# Patient Record
Sex: Male | Born: 1975 | Race: White | Hispanic: No | Marital: Married | Smoking: Never smoker
Health system: Southern US, Community
[De-identification: ages and names within clinical notes are randomized; demographics above are authoritative.]

---

## 2020-04-06 ENCOUNTER — Ambulatory Visit
Admission: RE | Admit: 2020-04-06 | Discharge: 2020-04-06 | Disposition: A | Discharge status: Home / Self Care | Payer: 59 | Source: Ambulatory Visit | Attending: Family Medicine | Admitting: Family Medicine

## 2020-04-06 ENCOUNTER — Other Ambulatory Visit: Payer: Self-pay

## 2020-04-06 ENCOUNTER — Encounter: Payer: Self-pay | Admitting: Family Medicine

## 2020-04-06 ENCOUNTER — Ambulatory Visit (INDEPENDENT_AMBULATORY_CARE_PROVIDER_SITE_OTHER): Payer: 59 | Admitting: Family Medicine

## 2020-04-06 VITALS — BP 120/70 | HR 62 | Temp 98.5°F | Ht 70.0 in | Wt 199.6 lb

## 2020-04-06 DIAGNOSIS — Z8616 Personal history of COVID-19: Secondary | ICD-10-CM

## 2020-04-06 DIAGNOSIS — E7800 Pure hypercholesterolemia, unspecified: Secondary | ICD-10-CM | POA: Diagnosis not present

## 2020-04-06 DIAGNOSIS — Z8249 Family history of ischemic heart disease and other diseases of the circulatory system: Secondary | ICD-10-CM | POA: Diagnosis not present

## 2020-04-06 DIAGNOSIS — R0789 Other chest pain: Secondary | ICD-10-CM | POA: Diagnosis not present

## 2020-04-06 LAB — LIPID PANEL

## 2020-04-06 NOTE — Patient Instructions (Signed)
You will hear from [facility].   Go to [facility] for your chest X ray.   I will be in touch with your lab results.

## 2020-04-06 NOTE — Progress Notes (Signed)
His chest XR is normal.

## 2020-04-06 NOTE — Progress Notes (Signed)
Subjective:    Patient ID: Thomas Kerr, male    DOB: March 13, 1976, 44 y.o.   MRN: [id number]  HPI Chief Complaint  Patient presents with  . new pt    new pt, had covid back in august, but work made him get covid vaccine, 11/1. arm hurt and pin needles on left side, under left arm having achness an then still having some arm pain,  nervous about getting second.    He is new to the practice. Moved here from [place].  Previous provider PCP in [place] he had Covid in August 2021. Symptoms with chills, headache, fatigue but did not have to go to the hospital.  States he was not back to his baseline but close. He was not able to keep up with his dog jogging like usual.  States prior to getting his vaccine, he was doing a new workout with his wife called "bodypump" which included push ups and a lot of upper body exercises.   States he received his initial Pfizer covid vaccine in his left arm on 03/08/2020. States the first week afterwards he felt achy and had arm pain. States week 2 after his vaccine, he noticed "pin prick" sensation in his left upper chest. This was intermittent and random. Not with exertion.  States pain woke him up at times.  States he is not currently having pain.   Pain then became a dull pain and spread across his chest. More recently he has noticed a throbbing sensation under his left axilla that is intermittent. No aggravating or alleviating factors.  No coughing, orthopnea, shortness of breath, abdominal pain, N/V/D, LE edema. Denies fever, chills, or dizziness.   He questions whether he should get his 2nd covid vaccine.states it is required for his job.    His father had heart valve replaced and aneurysm repair at age 72.  PGF died of a heart attack at age 68.  Mother with HTN   Hx of HL. LDL 161 in 2020.  Hx of renal insufficiency   History of blood in stool. Saw GI in [place] for this.   He is married with 3 children ages 4,8, and 77. Works as Advice worker.     Review of Systems Pertinent positives and negatives in the history of present illness.     Objective:   Physical Exam BP 120/70   Pulse 62   Temp 98.5 F (36.9 C)   Ht 5\' 10"  (1.778 m)   Wt 199 lb 9.6 oz (90.5 kg)   BMI 28.64 kg/m   Alert and in no distress.  Cardiac exam shows a regular sinus rhythm without murmurs or gallops. Lungs are clear to auscultation.  No chest wall tenderness or abnormalities.  No axillary lymphadenopathy.  Extremities without edema.  Skin is warm and dry.      Assessment & Plan:  Intermittent left-sided chest pain - Plan: CBC with Differential/Platelet, Comprehensive metabolic panel, Lipid panel, DG Chest 2 View, Ambulatory referral to Cardiology  -He is new to practice and here to establish care.  Reports that he is fasting today.  He is not actively having chest pain. Is concerned that his pain may be related to the Covid vaccine that he received on March 08, 2020 and he is hesitant to get his second vaccine which is due.  Discussed that he had more cardiopulmonary symptoms after having Covid infection in August 2021 and that it is difficult to discern whether this may be  related.  EKG shows NSR, rate 68. No acute changes. Read by myself and Dr. Susann Givens.  Discussed risk factors for heart disease which are few including elevated LDL and family history. I will send him for chest x-ray, get blood work and refer to cardiology for further evaluation.  Elevated LDL cholesterol level - Plan: Lipid panel -Discussed that his LDL was 161 in 2020 when checked by his previous PCP.  Recommend low-fat, low-cholesterol diet and getting adequate exercise.  He typically exercises regularly but since his chest pain he has not been quite as active.  Family history of heart attack - Plan: Ambulatory referral to Cardiology  Family history of heart valve abnormality - Plan: Ambulatory referral to Cardiology  History of COVID-19 - Plan: Ambulatory referral  to Cardiology -Reports fully recovering since Covid infection in August 2021.

## 2020-04-07 LAB — LIPID PANEL
Chol/HDL Ratio: 4.2 ratio (ref 0.0–5.0)
Cholesterol, Total: 256 mg/dL — ABNORMAL HIGH (ref 100–199)
HDL: 61 mg/dL (ref >39)
LDL Chol Calc (NIH): 177 mg/dL — ABNORMAL HIGH (ref 0–99)
Triglycerides: 101 mg/dL (ref 0–149)
VLDL Cholesterol Cal: 18 mg/dL (ref 5–40)

## 2020-04-07 LAB — COMPREHENSIVE METABOLIC PANEL
ALT: 46 IU/L — ABNORMAL HIGH (ref 0–44)
AST: 24 IU/L (ref 0–40)
Albumin/Globulin Ratio: 2.5 — ABNORMAL HIGH (ref 1.2–2.2)
Albumin: 5.2 g/dL — ABNORMAL HIGH (ref 4.0–5.0)
Alkaline Phosphatase: 84 IU/L (ref 44–121)
BUN/Creatinine Ratio: 9 (ref 9–20)
BUN: 13 mg/dL (ref 6–24)
Bilirubin Total: 0.6 mg/dL (ref 0.0–1.2)
CO2: 23 mmol/L (ref 20–29)
Calcium: 9.7 mg/dL (ref 8.7–10.2)
Chloride: 101 mmol/L (ref 96–106)
Creatinine, Ser: 1.41 mg/dL — ABNORMAL HIGH (ref 0.76–1.27)
GFR calc Af Amer: 71 mL/min/{1.73_m2} (ref >59)
GFR calc non Af Amer: 61 mL/min/{1.73_m2} (ref >59)
Globulin, Total: 2.1 g/dL (ref 1.5–4.5)
Glucose: 102 mg/dL — ABNORMAL HIGH (ref 65–99)
Potassium: 4.5 mmol/L (ref 3.5–5.2)
Sodium: 141 mmol/L (ref 134–144)
Total Protein: 7.3 g/dL (ref 6.0–8.5)

## 2020-04-07 LAB — CBC WITH DIFFERENTIAL/PLATELET
Basophils Absolute: 0 10*3/uL (ref 0.0–0.2)
Basos: 1 %
EOS (ABSOLUTE): 0 10*3/uL (ref 0.0–0.4)
Eos: 0 %
Hematocrit: 46.6 % (ref 37.5–51.0)
Hemoglobin: 16 g/dL (ref 13.0–17.7)
Immature Grans (Abs): 0 10*3/uL (ref 0.0–0.1)
Immature Granulocytes: 0 %
Lymphocytes Absolute: 1.5 10*3/uL (ref 0.7–3.1)
Lymphs: 24 %
MCH: 29.7 pg (ref 26.6–33.0)
MCHC: 34.3 g/dL (ref 31.5–35.7)
MCV: 87 fL (ref 79–97)
Monocytes Absolute: 0.4 10*3/uL (ref 0.1–0.9)
Monocytes: 7 %
Neutrophils Absolute: 4.3 10*3/uL (ref 1.4–7.0)
Neutrophils: 68 %
Platelets: 308 10*3/uL (ref 150–450)
RBC: 5.38 x10E6/uL (ref 4.14–5.80)
RDW: 12.1 % (ref 11.6–15.4)
WBC: 6.3 10*3/uL (ref 3.4–10.8)

## 2020-04-08 ENCOUNTER — Encounter: Payer: Self-pay | Admitting: Family Medicine

## 2020-04-08 DIAGNOSIS — R7989 Other specified abnormal findings of blood chemistry: Secondary | ICD-10-CM

## 2020-04-08 DIAGNOSIS — R7401 Elevation of levels of liver transaminase levels: Secondary | ICD-10-CM | POA: Insufficient documentation

## 2020-04-08 HISTORY — DX: Elevation of levels of liver transaminase levels: R74.01

## 2020-04-08 HISTORY — DX: Other specified abnormal findings of blood chemistry: R79.89

## 2020-04-08 NOTE — Progress Notes (Signed)
His kidney function is abnormal as it has been in the past.  Also his LDL or bad cholesterol is significantly elevated.  This may be due to a familial reason and not necessarily his diet or exercise routine.  This is a risk factor for heart disease I do recommend that he discuss this with his cardiologist at his upcoming appointment.  One of his liver enzymes, the ALT, is just marginally elevated.  I recommend that he return for a follow up visit and we will recheck his kidney and liver functions in 1 month. In the meantime he should stay well-hydrated, avoid alcohol and eat a low-fat diet.

## 2020-04-15 ENCOUNTER — Other Ambulatory Visit: Payer: Self-pay

## 2020-04-15 ENCOUNTER — Encounter: Payer: Self-pay | Admitting: Internal Medicine

## 2020-04-15 ENCOUNTER — Ambulatory Visit (INDEPENDENT_AMBULATORY_CARE_PROVIDER_SITE_OTHER): Payer: 59 | Admitting: Internal Medicine

## 2020-04-15 VITALS — BP 138/92 | HR 61 | Ht 70.0 in | Wt 202.0 lb

## 2020-04-15 DIAGNOSIS — U071 COVID-19: Secondary | ICD-10-CM

## 2020-04-15 DIAGNOSIS — E782 Mixed hyperlipidemia: Secondary | ICD-10-CM | POA: Diagnosis not present

## 2020-04-15 DIAGNOSIS — R079 Chest pain, unspecified: Secondary | ICD-10-CM

## 2020-04-15 DIAGNOSIS — Z8249 Family history of ischemic heart disease and other diseases of the circulatory system: Secondary | ICD-10-CM

## 2020-04-15 NOTE — Progress Notes (Signed)
Cardiology Office Note:    Date:  04/15/2020   ID:  Thomas Kerr, DOB 03-02-1976, MRN [id number]  PCP:  Avanell Shackleton, NP-C  [facility] Cardiologist:  No primary care provider on file.  [facility] Electrophysiologist:  None   CC: Chest Pain Consulted for the evaluation of chest pain at the behest of [place], Vickie L, NP-C  History of Present Illness:    Thomas Kerr is a 44 y.o. male with a hx of HLD, prior history of COVID-19 in 8/21,  CKD, mild elevation in AST/ALT who presents for evaluation.  Patient notes that he is feeling of needle prick chest pain on both sides.  More of a dull, position pain. Resolved over time.  Has improved in frequency with time.  [place]  Last 10-15 seconds.   Discomfort occured spontaneously one week after COVID Vaccine (November, had August COVID-infection) worsens with position, and improves with massage.  No exertional chest pain and is able to do yardwork and exercise but has paused exercise presently. No syncope or near syncope.  No SOB and DOE  Notes no palpitations or funny heart beats.     Patient reports NO prior cardiac testing including  echo,  stress test,  heart catheterizations.   Past Medical History:  Diagnosis Date  . Elevated ALT measurement 04/08/2020  . Elevated serum creatinine 04/08/2020    No past surgical history on file.  Current Medications: No outpatient medications have been marked as taking for the 04/15/20 encounter (Office Visit) with Christell Constant, MD.     Allergies:   Amoxicillin   Social History   Socioeconomic History  . Marital status: Married    Spouse name: Not on file  . Number of children: Not on file  . Years of education: Not on file  . Highest education level: Not on file  Occupational History  . Not on file  Tobacco Use  . Smoking status: Never Smoker  . Smokeless tobacco: Never Used  Substance and Sexual Activity  . Alcohol use: Yes     Comment: rarely   . Drug use: Never  . Sexual activity: Not on file  Other Topics Concern  . Not on file  Social History Narrative  . Not on file   Social Determinants of Health   Financial Resource Strain: Not on file  Food Insecurity: Not on file  Transportation Needs: Not on file  Physical Activity: Not on file  Stress: Not on file  Social Connections: Not on file     Family History: The patient's family history is relevant for below: Denies family history of sudden cardiac death including drowning, car accidents, or unexplained deaths in the family outside of grandfather below No history of non-ischemic cardiomyopathies including hypertrophic cardiomyopathy, left ventricular non-compaction, or arrhythmogenic right ventricular cardiomyopathy. No history of bicuspid aortic valve but has a history of aortic aneurysm in father possible aortic dissection in his grandfather at age 63. History of coronary artery disease notable for no members. History of arrhythmia notable for no members.  ROS:   Please see the history of present illness.     All other systems reviewed and are negative.  EKGs/Labs/Other Studies Reviewed:    The following studies were reviewed today:  EKG:   04/06/20: SR 68 WNL, no PR depression or ST elevation  Recent Labs: 04/06/2020: ALT 46; BUN 13; Creatinine, Ser 1.41; Hemoglobin 16.0; Platelets 308; Potassium 4.5; Sodium 141  Recent Lipid Panel  Component Value Date/Time   CHOL 256 (H) 04/06/2020 1143   TRIG 101 04/06/2020 1143   HDL 61 04/06/2020 1143   CHOLHDL 4.2 04/06/2020 1143   LDLCALC 177 (H) 04/06/2020 1143    Risk Assessment/Calculations:     The 10-year ASCVD risk score Mikey Bussing DC Brooke Bonito., et al., 2013) is: 2%   Values used to calculate the score:     Age: 13 years     Sex: Male     Is Non-Hispanic African American: No     Diabetic: No     Tobacco smoker: No     Systolic Blood Pressure: 622 mmHg     Is BP treated: No     HDL  Cholesterol: 61 mg/dL     Total Cholesterol: 256 mg/dL   Physical Exam:    VS:  BP (!) 138/92   Pulse 61   Ht _0  (1.778 m)   Wt 202 lb (91.6 kg)   SpO2 97%   BMI 28.98 kg/m     Wt Readings from Last 3 Encounters:  04/15/20 202 lb (91.6 kg)  04/06/20 199 lb 9.6 oz (90.5 kg)    GEN:  Well nourished, well developed in no acute distress HEENT: Normal NECK: No JVD; No carotid bruits LYMPHATICS: No lymphadenopathy CARDIAC: RRR, no murmurs, rubs, gallops RESPIRATORY:  Clear to auscultation without rales, wheezing or rhonchi  ABDOMEN: Soft, non-tender, non-distended MUSCULOSKELETAL:  No edema; No deformity  SKIN: Warm and dry NEUROLOGIC:  Alert and oriented x 3 PSYCHIATRIC:  Normal affect   ASSESSMENT:    1. Chest pain of uncertain etiology   2. Mixed hyperlipidemia   3. Family history of aortic aneurysm   4. COVID    PLAN:    In order of problems listed above:  Chest Pain CKD In the setting of COVID 19 HLD - get hsCRP and ESR - BMP, CBC - ASCVD risk 2%; will try dietary intervention to start - Would recommend an echocardiogram to assess LVEF and exclude WMA.  - continue ambulatory BP monitoring - will defer exercise until after testing; low threshold for CMR  Hyperlipidemia -LDL goal above 100 - ASCVD risk as above - gave education on dietary changes  Family history of Aortic Aneurysm with possible history of Aortic Dissection - Will get echocardiogram  3 month follow up unless new symptoms or abnormal test results warranting change in plan  Would be reasonable for  APP Follow up   Shared Decision Making/Informed Consent      Patient amenable to plan  Medication Adjustments/Labs and Tests Ordered: Current medicines are reviewed at length with the patient today.  Concerns regarding medicines are outlined above.  Orders Placed This Encounter  Procedures  . CRP High sensitivity  . Sedimentation rate  . CBC  . Basic metabolic panel  .  ECHOCARDIOGRAM COMPLETE   No orders of the defined types were placed in this encounter.   Patient Instructions  Medication Instructions:   Your physician recommends that you continue on your current medications as directed. Please refer to the Current Medication list given to you today.  *If you need a refill on your cardiac medications before your next appointment, please call your pharmacy*   Lab Work:  TODAY--CRP HIGH SENSITIVITY, ESR, CBC, BMET  If you have labs (blood work) drawn today and your tests are completely normal, you will receive your results only by: Marland Kitchen MyChart Message (if you have MyChart) OR . A paper copy in the mail If  you have any lab test that is abnormal or we need to change your treatment, we will call you to review the results.  Testing/Procedures:  Your physician has requested that you have an echocardiogram. Echocardiography is a painless test that uses sound waves to create images of your heart. It provides your doctor with information about the size and shape of your heart and how well your heart's chambers and valves are working. This procedure takes approximately one hour. There are no restrictions for this procedure.   Follow-Up:  3 MONTHS IN THE OFFICE WITH DR. Gasper Sells      Signed, Werner Lean, MD  04/15/2020 9:40 AM    [facility]

## 2020-04-15 NOTE — Patient Instructions (Signed)
Medication Instructions:   Your physician recommends that you continue on your current medications as directed. Please refer to the Current Medication list given to you today.  *If you need a refill on your cardiac medications before your next appointment, please call your pharmacy*   Lab Work:  TODAY--CRP HIGH SENSITIVITY, ESR, CBC, BMET  If you have labs (blood work) drawn today and your tests are completely normal, you will receive your results only by: Marland Kitchen MyChart Message (if you have MyChart) OR . A paper copy in the mail If you have any lab test that is abnormal or we need to change your treatment, we will call you to review the results.  Testing/Procedures:  Your physician has requested that you have an echocardiogram. Echocardiography is a painless test that uses sound waves to create images of your heart. It provides your doctor with information about the size and shape of your heart and how well your heart's chambers and valves are working. This procedure takes approximately one hour. There are no restrictions for this procedure.   Follow-Up:  3 MONTHS IN THE OFFICE WITH DR. Gasper Sells

## 2020-04-16 LAB — CBC
Hematocrit: 46.3 % (ref 37.5–51.0)
Hemoglobin: 15.5 g/dL (ref 13.0–17.7)
MCH: 28.9 pg (ref 26.6–33.0)
MCHC: 33.5 g/dL (ref 31.5–35.7)
MCV: 86 fL (ref 79–97)
Platelets: 287 10*3/uL (ref 150–450)
RBC: 5.36 x10E6/uL (ref 4.14–5.80)
RDW: 12.3 % (ref 11.6–15.4)
WBC: 6.5 10*3/uL (ref 3.4–10.8)

## 2020-04-16 LAB — BASIC METABOLIC PANEL
BUN/Creatinine Ratio: 9 (ref 9–20)
BUN: 12 mg/dL (ref 6–24)
CO2: 24 mmol/L (ref 20–29)
Calcium: 9.9 mg/dL (ref 8.7–10.2)
Chloride: 101 mmol/L (ref 96–106)
Creatinine, Ser: 1.33 mg/dL — ABNORMAL HIGH (ref 0.76–1.27)
GFR calc Af Amer: 76 mL/min/{1.73_m2} (ref >59)
GFR calc non Af Amer: 65 mL/min/{1.73_m2} (ref >59)
Glucose: 99 mg/dL (ref 65–99)
Potassium: 4.8 mmol/L (ref 3.5–5.2)
Sodium: 138 mmol/L (ref 134–144)

## 2020-04-16 LAB — HIGH SENSITIVITY CRP: CRP, High Sensitivity: 0.48 mg/L (ref 0.00–3.00)

## 2020-04-16 LAB — SEDIMENTATION RATE: Sed Rate: 2 mm/hr (ref 0–15)

## 2020-04-20 ENCOUNTER — Ambulatory Visit (HOSPITAL_COMMUNITY): Payer: 59 | Attending: Cardiology

## 2020-04-20 ENCOUNTER — Other Ambulatory Visit: Payer: Self-pay

## 2020-04-20 DIAGNOSIS — U071 COVID-19: Secondary | ICD-10-CM | POA: Diagnosis present

## 2020-04-20 DIAGNOSIS — Z8249 Family history of ischemic heart disease and other diseases of the circulatory system: Secondary | ICD-10-CM | POA: Diagnosis not present

## 2020-04-20 DIAGNOSIS — R079 Chest pain, unspecified: Secondary | ICD-10-CM

## 2020-04-20 LAB — ECHOCARDIOGRAM COMPLETE
Area-P 1/2: 3.65 cm2
S' Lateral: 2.9 cm

## 2020-05-10 ENCOUNTER — Ambulatory Visit: Payer: 59 | Admitting: Family Medicine

## 2020-07-14 ENCOUNTER — Ambulatory Visit: Payer: 59 | Admitting: Internal Medicine

## 2020-10-24 ENCOUNTER — Encounter (HOSPITAL_COMMUNITY): Payer: Self-pay | Admitting: Emergency Medicine

## 2020-10-24 ENCOUNTER — Emergency Department (HOSPITAL_COMMUNITY)
Admission: EM | Admit: 2020-10-24 | Discharge: 2020-10-24 | Disposition: A | Discharge status: Home / Self Care | Payer: 59 | Attending: Emergency Medicine | Admitting: Emergency Medicine

## 2020-10-24 ENCOUNTER — Other Ambulatory Visit: Payer: Self-pay

## 2020-10-24 ENCOUNTER — Emergency Department (HOSPITAL_COMMUNITY): Payer: 59

## 2020-10-24 DIAGNOSIS — R0789 Other chest pain: Secondary | ICD-10-CM | POA: Insufficient documentation

## 2020-10-24 DIAGNOSIS — R5383 Other fatigue: Secondary | ICD-10-CM | POA: Diagnosis not present

## 2020-10-24 DIAGNOSIS — R42 Dizziness and giddiness: Secondary | ICD-10-CM | POA: Insufficient documentation

## 2020-10-24 DIAGNOSIS — R0602 Shortness of breath: Secondary | ICD-10-CM | POA: Diagnosis not present

## 2020-10-24 DIAGNOSIS — Z8616 Personal history of COVID-19: Secondary | ICD-10-CM | POA: Diagnosis not present

## 2020-10-24 DIAGNOSIS — R079 Chest pain, unspecified: Secondary | ICD-10-CM

## 2020-10-24 LAB — TROPONIN I (HIGH SENSITIVITY)
Troponin I (High Sensitivity): 3 ng/L (ref <18)
Troponin I (High Sensitivity): <2 ng/L (ref <18)

## 2020-10-24 LAB — BASIC METABOLIC PANEL
Anion gap: 6 (ref 5–15)
BUN: 16 mg/dL (ref 6–20)
CO2: 28 mmol/L (ref 22–32)
Calcium: 8.9 mg/dL (ref 8.9–10.3)
Chloride: 102 mmol/L (ref 98–111)
Creatinine, Ser: 1.38 mg/dL — ABNORMAL HIGH (ref 0.61–1.24)
GFR, Estimated: >60 mL/min (ref >60)
Glucose, Bld: 94 mg/dL (ref 70–99)
Potassium: 3.8 mmol/L (ref 3.5–5.1)
Sodium: 136 mmol/L (ref 135–145)

## 2020-10-24 LAB — CBC
HCT: 48 % (ref 39.0–52.0)
Hemoglobin: 16.1 g/dL (ref 13.0–17.0)
MCH: 29.8 pg (ref 26.0–34.0)
MCHC: 33.5 g/dL (ref 30.0–36.0)
MCV: 88.7 fL (ref 80.0–100.0)
Platelets: 276 10*3/uL (ref 150–400)
RBC: 5.41 MIL/uL (ref 4.22–5.81)
RDW: 11.6 % (ref 11.5–15.5)
WBC: 7.9 10*3/uL (ref 4.0–10.5)
nRBC: 0 % (ref 0.0–0.2)

## 2020-10-24 LAB — D-DIMER, QUANTITATIVE: D-Dimer, Quant: 0.38 ug/mL-FEU (ref 0.00–0.50)

## 2020-10-24 MED ORDER — KETOROLAC TROMETHAMINE 15 MG/ML IJ SOLN
15.0000 mg | Freq: Once | INTRAMUSCULAR | Status: AC
  Administered 2020-10-24: 15 mg via INTRAVENOUS
  Filled 2020-10-24: qty 1

## 2020-10-24 MED ORDER — SODIUM CHLORIDE 0.9 % IV BOLUS
1000.0000 mL | Freq: Once | INTRAVENOUS | Status: AC
  Administered 2020-10-24: 1000 mL via INTRAVENOUS

## 2020-10-24 NOTE — ED Notes (Signed)
Patient verbalized understanding of discharge instructions. Opportunity for questions and answers.  

## 2020-10-24 NOTE — ED Triage Notes (Signed)
Pt here from home for L side dull intermittent cp x2-3 days w/ lightheadedness, sob, and fatigue.

## 2020-10-24 NOTE — Discharge Instructions (Addendum)
If you develop recurrent, continued, or worsening chest pain, shortness of breath, fever, vomiting, abdominal or back pain, or any other new/concerning symptoms then return to the ER for evaluation.  

## 2020-10-24 NOTE — ED Provider Notes (Signed)
Emergency Medicine Provider Triage Evaluation Note  Thomas Kerr , a 45 y.o. male  was evaluated in triage.  Pt complains of gradual onset, constant, waxing and waning, dull/achy, left sided chest pain x 2-3 days. Pt now having SOB at rest and lightheadedness. Went to UC and was sent here for further eval. Was given 324 mg ASA at UC without much relief. Currently a 3/10. Marland Kitchen  Review of Systems  Positive: + chest pain, SOB, lightheaded Negative: - nausea, vomiting  Physical Exam  BP (!) 161/104 (BP Location: Left Arm)   Pulse 88   Temp 98.7 F (37.1 C) (Oral)   Resp 18   Ht 5\' 10"  (1.778 m)   Wt 88.5 kg   SpO2 99%   BMI 27.98 kg/m  Gen:   Awake, no distress   Resp:  Normal effort  MSK:   Moves extremities without difficulty  Other:    Medical Decision Making  Medically screening exam initiated at 3:46 PM.  Appropriate orders placed.  Thomas Kerr was informed that the remainder of the evaluation will be completed by another provider, this initial triage assessment does not replace that evaluation, and the importance of remaining in the ED until their evaluation is complete.     Thomas Laine, PA-C 10/24/20 1548    10/26/20, MD 10/24/20 [phone]

## 2020-10-24 NOTE — ED Provider Notes (Signed)
[facility] EMERGENCY DEPARTMENT Provider Note   CSN: [id number] Arrival date & time: 10/24/20  1519     History Chief Complaint  Patient presents with   Chest Pain    Thomas Kerr is a 45 y.o. male.  HPI 45 year old male presents with chest pain.  It has been there for at least the last 2-3 days.  Comes and goes many times a day and is a dull sensation just left of the midline.  Usually lasts only a few seconds.  He is also been short of breath this entire time as well.  Today he felt fatigued/lightheaded and so he want to come get checked out.  This is different than the sharp chest pain he got a work-up for last year.  He states he had a cold a few weeks ago but no current cough.  No pleuritic symptoms.  Nothing he does makes it better or worse but it occurs numerous times per day.  No leg swelling or recent travel.  Past Medical History:  Diagnosis Date   Elevated ALT measurement 04/08/2020   Elevated serum creatinine 04/08/2020    Patient Active Problem List   Diagnosis Date Noted   Elevated ALT measurement 04/08/2020   Elevated serum creatinine 04/08/2020   Intermittent left-sided chest pain 04/06/2020   Elevated LDL cholesterol level 04/06/2020   Family history of heart valve abnormality 04/06/2020   History of COVID-19 04/06/2020    History reviewed. No pertinent surgical history.     History reviewed. No pertinent family history.  Social History   Tobacco Use   Smoking status: Never   Smokeless tobacco: Never  Substance Use Topics   Alcohol use: Yes    Comment: rarely    Drug use: Never    Home Medications Prior to Admission medications   Not on File    Allergies    Amoxicillin  Review of Systems   Review of Systems  Constitutional:  Positive for fatigue.  Respiratory:  Positive for shortness of breath. Negative for cough.   Cardiovascular:  Positive for chest pain. Negative for leg swelling.  Gastrointestinal:  Negative for  abdominal pain.  Neurological:  Positive for light-headedness.  All other systems reviewed and are negative.  Physical Exam Updated Vital Signs BP 132/85   Pulse (!) 58   Temp 98.1 F (36.7 C) (Oral)   Resp 20   Ht 5\' 10"  (1.778 m)   Wt 88.5 kg   SpO2 93%   BMI 27.98 kg/m   Physical Exam Vitals and nursing note reviewed.  Constitutional:      General: He is not in acute distress.    Appearance: He is well-developed. He is not ill-appearing or diaphoretic.  HENT:     Head: Normocephalic and atraumatic.     Right Ear: External ear normal.     Left Ear: External ear normal.     Nose: Nose normal.  Eyes:     General:        Right eye: No discharge.        Left eye: No discharge.  Cardiovascular:     Rate and Rhythm: Normal rate and regular rhythm.     Heart sounds: Normal heart sounds.  Pulmonary:     Effort: Pulmonary effort is normal.     Breath sounds: Normal breath sounds.  Abdominal:     Palpations: Abdomen is soft.     Tenderness: There is no abdominal tenderness.  Musculoskeletal:     Cervical  back: Neck supple.     Right lower leg: No edema.     Left lower leg: No edema.  Skin:    General: Skin is warm and dry.  Neurological:     Mental Status: He is alert.  Psychiatric:        Mood and Affect: Mood is not anxious.    ED Results / Procedures / Treatments   Labs (all labs ordered are listed, but only abnormal results are displayed) Labs Reviewed  BASIC METABOLIC PANEL - Abnormal; Notable for the following components:      Result Value   Creatinine, Ser 1.38 (*)    All other components within normal limits  CBC  D-DIMER, QUANTITATIVE  TROPONIN I (HIGH SENSITIVITY)  TROPONIN I (HIGH SENSITIVITY)    EKG EKG Interpretation  Date/Time:  Sunday October 24 2020 15:38:56 EDT Ventricular Rate:  76 PR Interval:  148 QRS Duration: 84 QT Interval:  358 QTC Calculation: 402 R Axis:   39 Text Interpretation: Normal sinus rhythm no acute ST/T changes No  old tracing to compare Confirmed by Pricilla Loveless [phone]) on 10/24/2020 4:36:05 PM  Radiology DG Chest 2 View  Result Date: 10/24/2020 CLINICAL DATA:  Patient with chest pain for 3 days. Shortness of breath. EXAM: CHEST - 2 VIEW COMPARISON:  Chest radiograph 04/06/2020. FINDINGS: Normal cardiac and mediastinal contours. No consolidative pulmonary opacities. No pleural effusion or pneumothorax. Thoracic spine degenerative changes. IMPRESSION: No active cardiopulmonary disease. Electronically Signed   By: Annia Belt M.D.   On: 10/24/2020 16:29    Procedures Procedures   Medications Ordered in ED Medications  sodium chloride 0.9 % bolus 1,000 mL (0 mLs Intravenous Stopped 10/24/20 1903)  ketorolac (TORADOL) 15 MG/ML injection 15 mg (15 mg Intravenous Given 10/24/20 1730)    ED Course  I have reviewed the triage vital signs and the nursing notes.  Pertinent labs & imaging results that were available during my care of the patient were reviewed by me and considered in my medical decision making (see chart for details).    MDM Rules/Calculators/A&P                          Patient is feeling better after IV Toradol and fluids.  His ECG is unremarkable.  Labs are also unremarkable.  Given his dyspnea component, D-dimer sent for otherwise low risk PE.  This D-dimer is negative and I do not think further work-up is needed.  Of note, his second troponin is listed as collected at 6:10 AM but should have been p.m.  I have recommended he try NSAIDs instead of aspirin at home and he otherwise is low risk for ACS and appears stable to follow-up with his PCP with return precautions. Final Clinical Impression(s) / ED Diagnoses Final diagnoses:  Nonspecific chest pain    Rx / DC Orders ED Discharge Orders     None        Pricilla Loveless, MD 10/24/20 2231

## 2021-03-11 ENCOUNTER — Telehealth: Payer: Self-pay

## 2021-03-11 NOTE — Telephone Encounter (Signed)
NOTES SCANNED TO REFERRAL 

## 2022-01-31 NOTE — Progress Notes (Deleted)
Cardiology Office Note:    Date:  01/31/2022   ID:  Thomas Kerr, DOB [birth year], MRN [id number]  PCP:  Avanell Shackleton, NP-C  [facility] Cardiologist:  None  [facility] Electrophysiologist:  None   CC: Aortic dilation f/u  History of Present Illness:    Thomas Kerr is a 46 y.o. male with a hx of HLD, prior history of COVID-19 in 8/21,  CKD, mild elevation in AST/ALT who presents for evaluation.  2021: found to have Mild Aortic dilation.  Has Fhx, unclear if FHX of dissection  Patient notes that he is doing ***.   Since last visit notes *** . There are no*** interval hospital/ED visit.    No chest pain or pressure ***.  No SOB/DOE*** and no PND/Orthopnea***.  No weight gain or leg swelling***.  No palpitations or syncope ***.  Ambulatory blood pressure ***.    Past Medical History:  Diagnosis Date   Elevated ALT measurement 04/08/2020   Elevated serum creatinine 04/08/2020    No past surgical history on file.  Current Medications: No outpatient medications have been marked as taking for the 02/01/22 encounter (Appointment) with Christell Constant, MD.     Allergies:   Amoxicillin   Social History   Socioeconomic History   Marital status: Married    Spouse name: Not on file   Number of children: Not on file   Years of education: Not on file   Highest education level: Not on file  Occupational History   Not on file  Tobacco Use   Smoking status: Never   Smokeless tobacco: Never  Substance and Sexual Activity   Alcohol use: Yes    Comment: rarely    Drug use: Never   Sexual activity: Not on file  Other Topics Concern   Not on file  Social History Narrative   Not on file   Social Determinants of Health   Financial Resource Strain: Not on file  Food Insecurity: Not on file  Transportation Needs: Not on file  Physical Activity: Not on file  Stress: Not on file  Social Connections: Not on file     Family History: The patient's family history  is relevant for below: Denies family history of sudden cardiac death including drowning, car accidents, or unexplained deaths in the family outside of grandfather below No history of non-ischemic cardiomyopathies including hypertrophic cardiomyopathy, left ventricular non-compaction, or arrhythmogenic right ventricular cardiomyopathy. No history of bicuspid aortic valve but has a history of aortic aneurysm in father possible aortic dissection in his grandfather at age 67. History of coronary artery disease notable for no members. History of arrhythmia notable for no members.  ROS:   Please see the history of present illness.     All other systems reviewed and are negative.  EKGs/Labs/Other Studies Reviewed:    The following studies were reviewed today:  EKG:   04/06/20: SR 68 WNL, no PR depression or ST elevation  ECHO COMPLETE WO IMAGING ENHANCING AGENT AND WITH 3D 04/20/2020  Narrative ECHOCARDIOGRAM REPORT    Patient Name:   Thomas Kerr    Date of Exam: 04/20/2020 Medical Rec #:  [id number]     Height:       70.0 in Accession #:    [id number]    Weight:       202.0 lb Date of Birth:  08-08-1975      BSA:          2.096 m Patient Age:  44 years      BP:           130/78 mmHg Patient Gender: M             HR:           68 bpm. Exam Location:  [address]  Procedure: 2D Echo, 3D Echo, Cardiac Doppler, Color Doppler and Strain Analysis  Indications:    R07.9 chest pain Z82.49 Family history of aortic aneurysm U07.1 Covid 19  History:        Patient has no prior history of Echocardiogram examinations. History of COVID 19, Signs/Symptoms:Chest Pain; Risk Factors:Dyslipidemia and Family history of aortic aneurysm.  Sonographer:    Chanetta Marshall BA, RDCS Referring Phys: [facility] A Chauna Osoria  IMPRESSIONS   1. Left ventricular ejection fraction, by estimation, is 55 to 60%. The left ventricle has normal function. The left ventricle has no regional wall motion  abnormalities. Left ventricular diastolic parameters were normal. 2. Right ventricular systolic function is normal. The right ventricular size is normal. 3. The mitral valve is normal in structure. Trivial mitral valve regurgitation. No evidence of mitral stenosis. 4. The aortic valve is tricuspid. Aortic valve regurgitation is not visualized. No aortic stenosis is present. 5. Aortic dilatation noted. There is mild dilatation of the aortic root, measuring 41 mm. 6. The inferior vena cava is normal in size with greater than 50% respiratory variability, suggesting right atrial pressure of 3 mmHg.  FINDINGS Left Ventricle: Left ventricular ejection fraction, by estimation, is 55 to 60%. The left ventricle has normal function. The left ventricle has no regional wall motion abnormalities. The left ventricular internal cavity size was normal in size. There is no left ventricular hypertrophy. Left ventricular diastolic parameters were normal.  Right Ventricle: The right ventricular size is normal. Right ventricular systolic function is normal.  Left Atrium: Left atrial size was normal in size.  Right Atrium: Right atrial size was normal in size.  Pericardium: There is no evidence of pericardial effusion.  Mitral Valve: The mitral valve is normal in structure. Trivial mitral valve regurgitation. No evidence of mitral valve stenosis.  Tricuspid Valve: The tricuspid valve is normal in structure. Tricuspid valve regurgitation is trivial. No evidence of tricuspid stenosis.  Aortic Valve: The aortic valve is tricuspid. Aortic valve regurgitation is not visualized. No aortic stenosis is present.  Pulmonic Valve: The pulmonic valve was normal in structure. Pulmonic valve regurgitation is trivial. No evidence of pulmonic stenosis.  Aorta: Aortic dilatation noted. There is mild dilatation of the aortic root, measuring 41 mm.  Venous: The inferior vena cava is normal in size with greater than 50%  respiratory variability, suggesting right atrial pressure of 3 mmHg.  IAS/Shunts: No atrial level shunt detected by color flow Doppler.   LEFT VENTRICLE PLAX 2D LVIDd:         4.40 cm  Diastology LVIDs:         2.90 cm  LV e' medial:    7.62 cm/s LV PW:         0.90 cm  LV E/e' medial:  6.5 LV IVS:        1.00 cm  LV e' lateral:   12.20 cm/s LVOT diam:     2.10 cm  LV E/e' lateral: 4.1 LV SV:         68 LV SV Index:   32 LVOT Area:     3.46 cm   RIGHT VENTRICLE  IVC RV Basal diam:  3.80 cm    IVC diam: 1.80 cm RV Mid diam:    3.60 cm RV S prime:     9.25 cm/s TAPSE (M-mode): 2.4 cm  LEFT ATRIUM             Index LA diam:        3.40 cm 1.62 cm/m LA Vol (A2C):   47.3 ml 22.56 ml/m LA Vol (A4C):   37.5 ml 17.89 ml/m LA Biplane Vol: 43.3 ml 20.66 ml/m AORTIC VALVE LVOT Vmax:   99.50 cm/s LVOT Vmean:  62.600 cm/s LVOT VTI:    0.196 m  AORTA Ao Root diam:  4.00 cm Ao Sinus diam: 4.00 cm Ao STJ diam:   3.2 cm Ao Asc diam:   3.50 cm  MITRAL VALVE MV Area (PHT):             SHUNTS MV Decel Time: 208 msec    Systemic VTI:  0.20 m MV E velocity: 49.50 cm/s  Systemic Diam: 2.10 cm MV A velocity: 37.20 cm/s MV E/A ratio:  1.33  Kirk Ruths MD Electronically signed by Kirk Ruths MD Signature Date/Time: 04/20/2020/4:07:08 PM    Final     Recent Labs: No results found for requested labs within last 365 days.  Recent Lipid Panel    Component Value Date/Time   CHOL 256 (H) 04/06/2020 1143   TRIG 101 04/06/2020 1143   HDL 61 04/06/2020 1143   CHOLHDL 4.2 04/06/2020 1143   LDLCALC 177 (H) 04/06/2020 1143    Physical Exam:    VS:  There were no vitals taken for this visit.    Wt Readings from Last 3 Encounters:  10/24/20 195 lb (88.5 kg)  04/15/20 202 lb (91.6 kg)  04/06/20 199 lb 9.6 oz (90.5 kg)    Gen: *** distress, *** obese/well nourished/malnourished   Neck: No JVD, *** carotid bruit Ears: *** Frank Sign Cardiac: No Rubs or  Gallops, *** Murmur, ***cardia, *** radial pulses Respiratory: Clear to auscultation bilaterally, *** effort, ***  respiratory rate GI: Soft, nontender, non-distended *** MS: No *** edema; *** moves all extremities Integument: Skin feels *** Neuro:  At time of evaluation, alert and oriented to person/place/time/situation *** Psych: Normal affect, patient feels ***   ASSESSMENT:    No diagnosis found.  PLAN:    Mild Aortic dilation Family history of Aortic Aneurysm with possible history of Aortic Dissection - Will get echocardiogram, likely yearly f/u Hyperlipidemia -LDL goal above 100  One year with APP Two years with me  Medication Adjustments/Labs and Tests Ordered: Current medicines are reviewed at length with the patient today.  Concerns regarding medicines are outlined above.  No orders of the defined types were placed in this encounter.  No orders of the defined types were placed in this encounter.   There are no Patient Instructions on file for this visit.   Signed, Werner Lean, MD  01/31/2022 8:15 AM    [facility]

## 2022-02-01 ENCOUNTER — Ambulatory Visit: Payer: 59 | Admitting: Internal Medicine

## 2022-07-31 IMAGING — CR DG CHEST 2V
2 series · 2 of 2 positions shown · non-contrast
Comparison: Chest radiograph 04/06/2020.

CLINICAL DATA: Patient with chest pain for 3 days. Shortness of
breath.

EXAM:
CHEST - 2 VIEW

[chest pa]
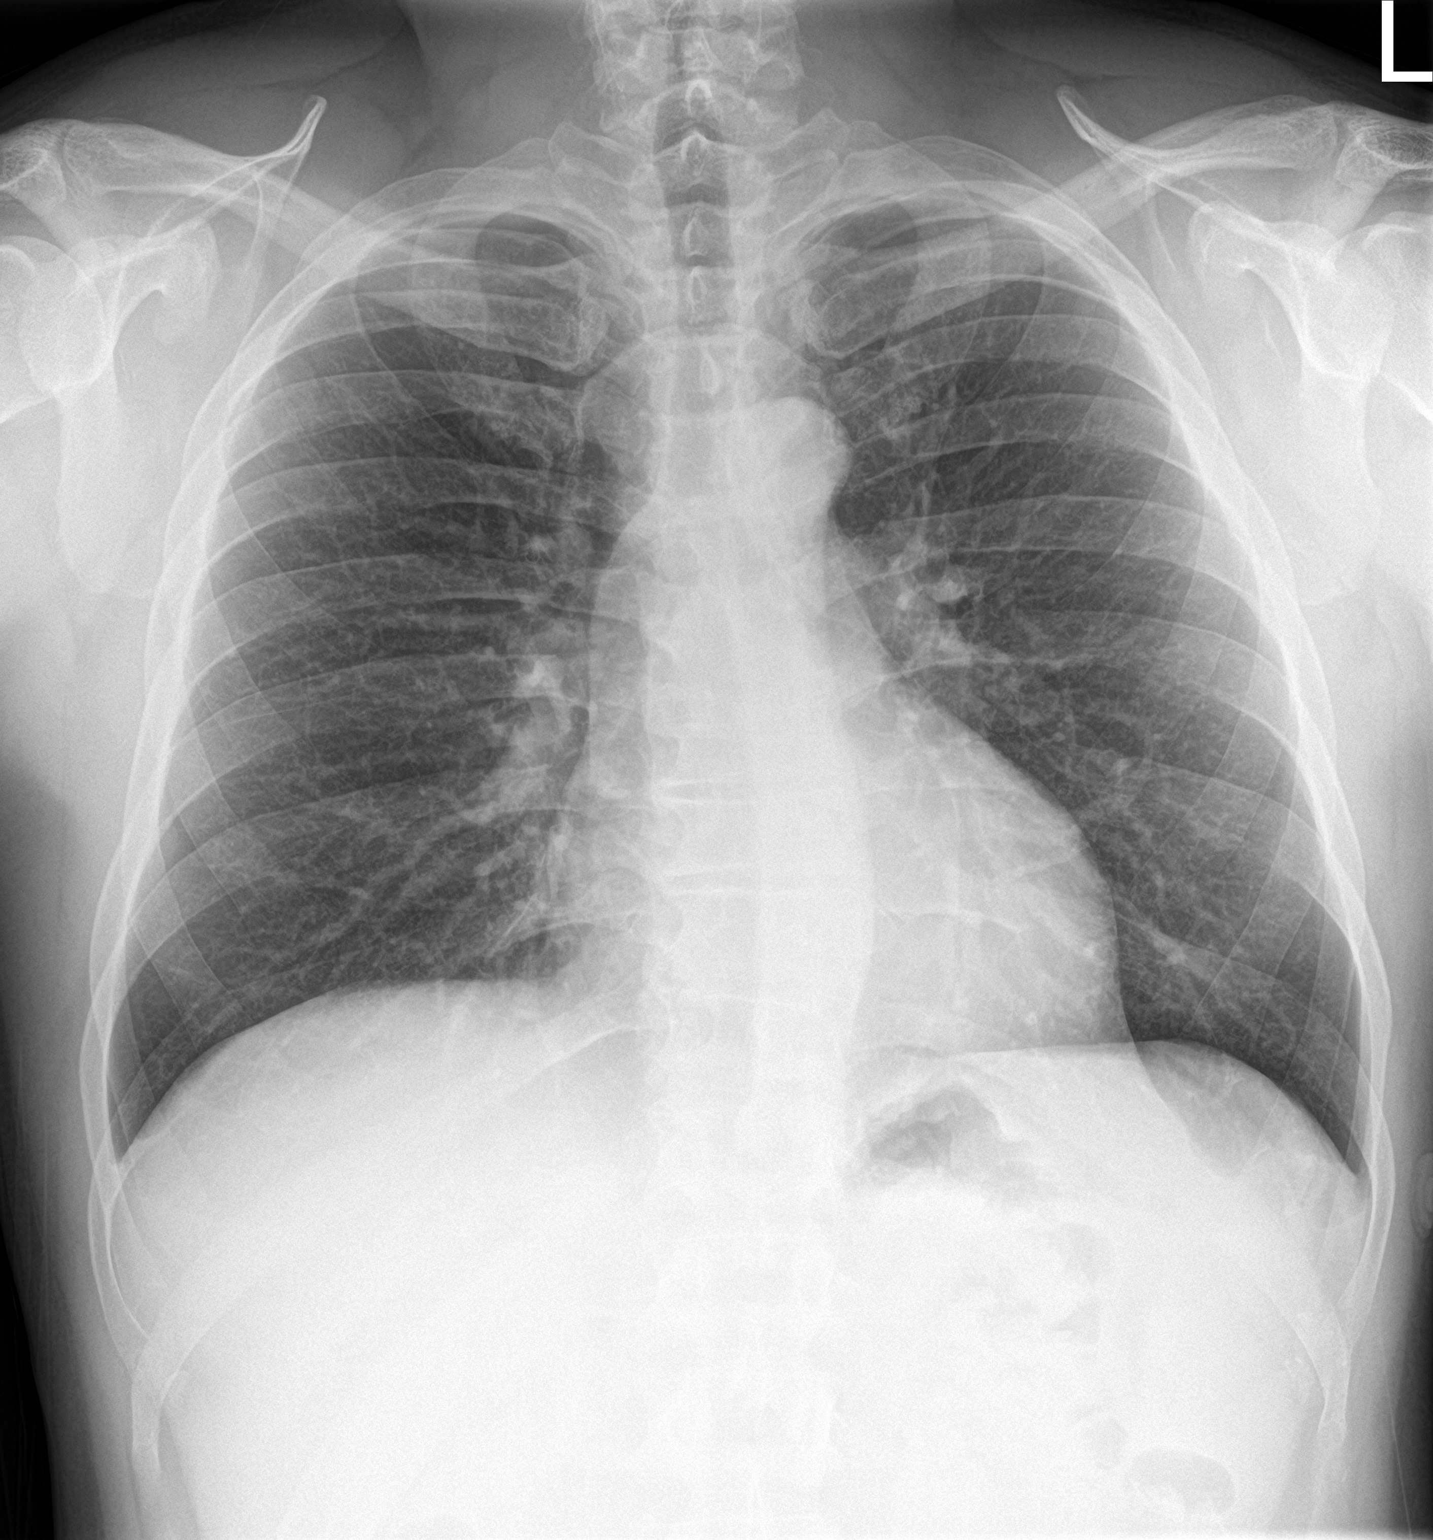

[chest lat]
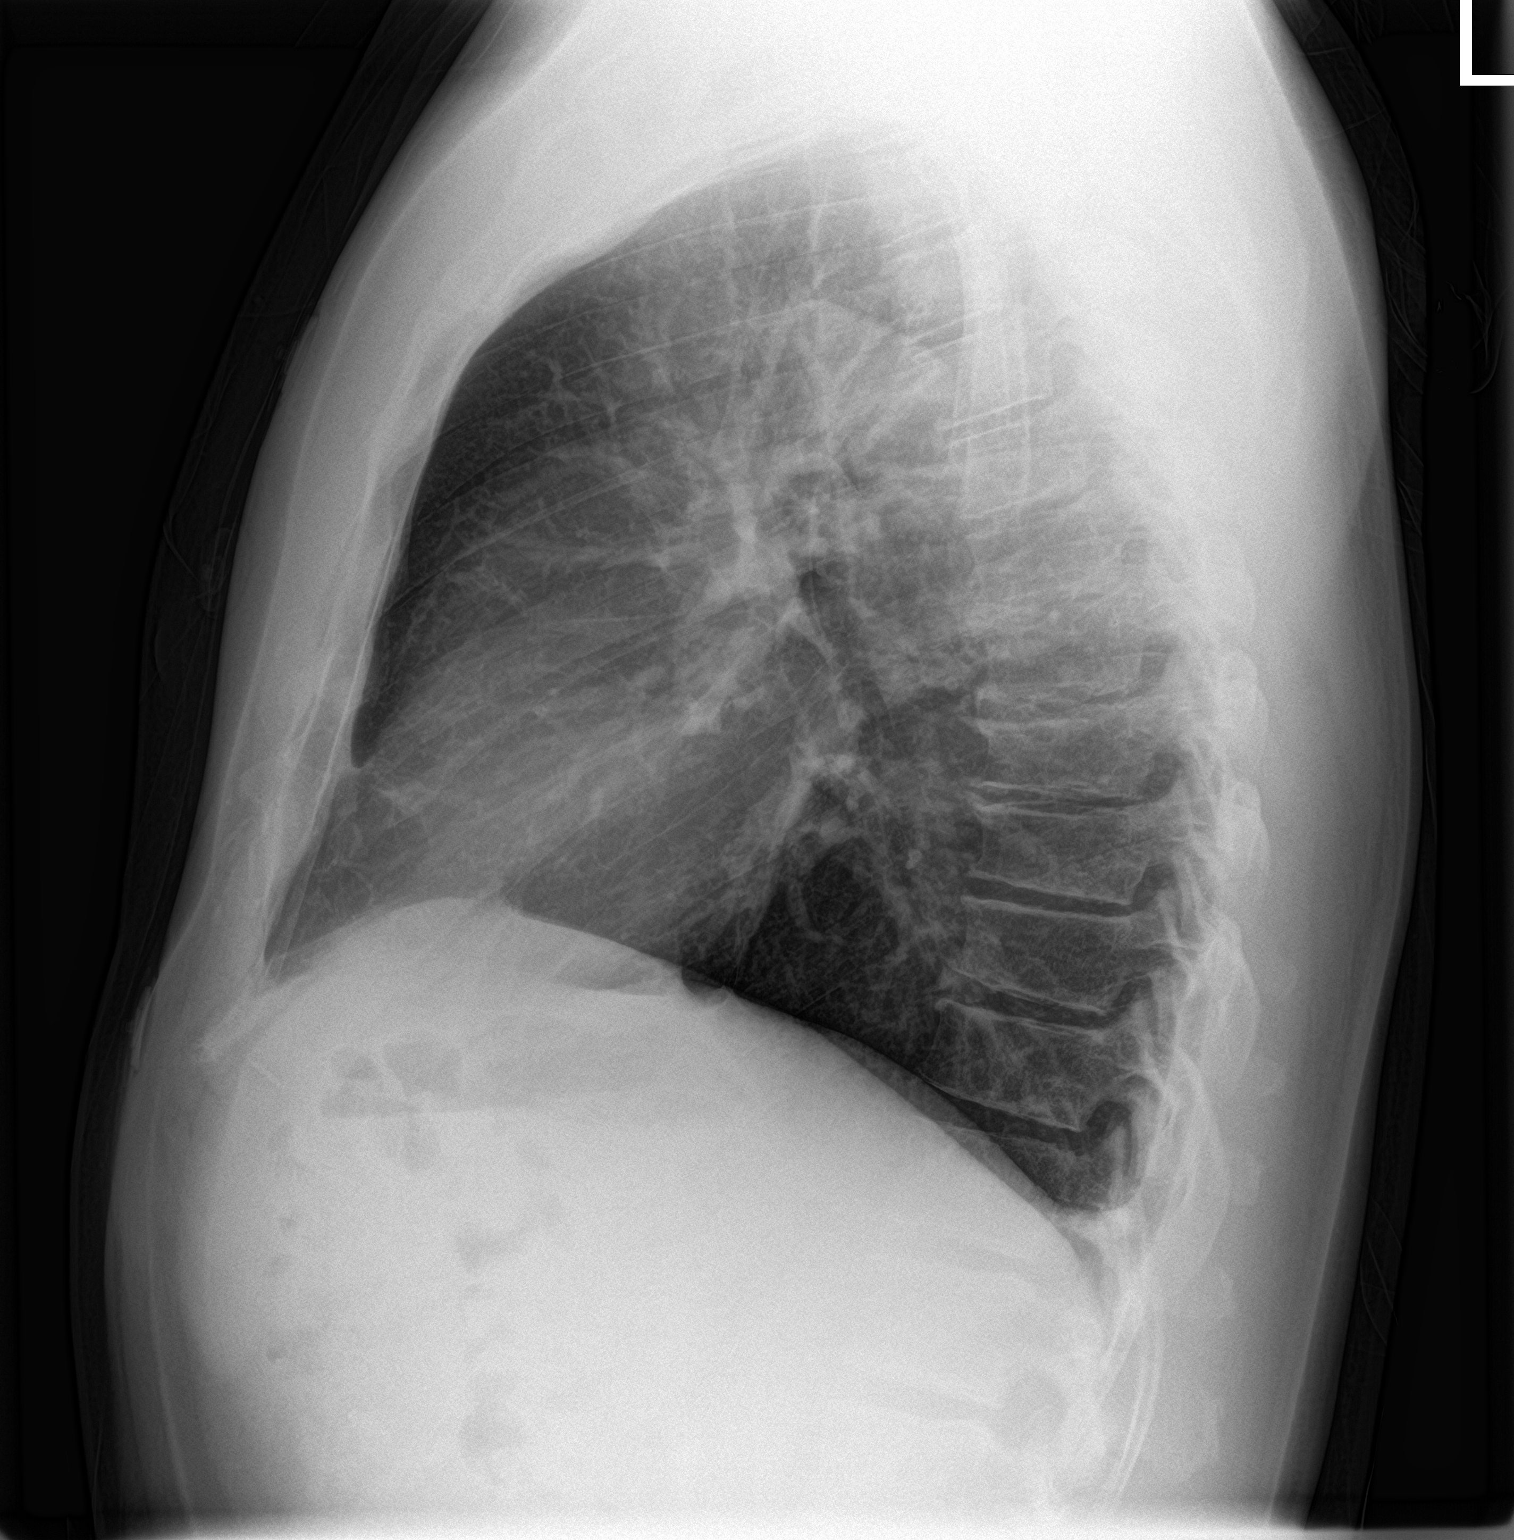

[2 of 2 positions shown; findings below may reference images not displayed]

FINDINGS: Normal cardiac and mediastinal contours. No consolidative pulmonary
opacities. No pleural effusion or pneumothorax. Thoracic spine
degenerative changes.
IMPRESSION: No active cardiopulmonary disease.
